# Patient Record
Sex: Female | Born: 1975 | Race: Black or African American | Hispanic: No | Marital: Single | Smoking: Never smoker
Health system: Southern US, Community
[De-identification: ages and names within clinical notes are randomized; demographics above are authoritative.]

## PROBLEM LIST (undated history)

## (undated) DIAGNOSIS — G935 Compression of brain: Secondary | ICD-10-CM

## (undated) DIAGNOSIS — K219 Gastro-esophageal reflux disease without esophagitis: Secondary | ICD-10-CM

## (undated) HISTORY — PX: OTHER SURGICAL HISTORY: SHX169

## (undated) HISTORY — PX: CRANIECTOMY SUBOCCIPITAL W/ CERVICAL LAMINECTOMY / CHIARI: SUR327

## (undated) HISTORY — PX: ABDOMINAL HYSTERECTOMY: SHX81

---

## 2018-06-08 ENCOUNTER — Emergency Department: Payer: BC Managed Care – PPO

## 2018-06-08 ENCOUNTER — Other Ambulatory Visit: Payer: Self-pay

## 2018-06-08 ENCOUNTER — Encounter: Payer: Self-pay | Admitting: Emergency Medicine

## 2018-06-08 ENCOUNTER — Emergency Department
Admission: EM | Admit: 2018-06-08 | Discharge: 2018-06-08 | Disposition: A | Discharge status: Home / Self Care | Payer: BC Managed Care – PPO | Attending: Emergency Medicine | Admitting: Emergency Medicine

## 2018-06-08 DIAGNOSIS — R55 Syncope and collapse: Secondary | ICD-10-CM

## 2018-06-08 DIAGNOSIS — I951 Orthostatic hypotension: Secondary | ICD-10-CM | POA: Diagnosis not present

## 2018-06-08 DIAGNOSIS — R42 Dizziness and giddiness: Secondary | ICD-10-CM | POA: Diagnosis present

## 2018-06-08 HISTORY — DX: Gastro-esophageal reflux disease without esophagitis: K21.9

## 2018-06-08 HISTORY — DX: Compression of brain: G93.5

## 2018-06-08 LAB — CBC WITH DIFFERENTIAL/PLATELET
Abs Immature Granulocytes: 0 10*3/uL (ref 0.00–0.07)
Basophils Absolute: 0 10*3/uL (ref 0.0–0.1)
Basophils Relative: 0 %
Eosinophils Absolute: 0.1 10*3/uL (ref 0.0–0.5)
Eosinophils Relative: 2 %
HCT: 39.7 % (ref 36.0–46.0)
HEMOGLOBIN: 13 g/dL (ref 12.0–15.0)
Immature Granulocytes: 0 %
Lymphocytes Relative: 29 %
Lymphs Abs: 1.7 10*3/uL (ref 0.7–4.0)
MCH: 27.5 pg (ref 26.0–34.0)
MCHC: 32.7 g/dL (ref 30.0–36.0)
MCV: 84.1 fL (ref 80.0–100.0)
Monocytes Absolute: 0.4 10*3/uL (ref 0.1–1.0)
Monocytes Relative: 6 %
Neutro Abs: 3.7 10*3/uL (ref 1.7–7.7)
Neutrophils Relative %: 63 %
Platelets: 308 10*3/uL (ref 150–400)
RBC: 4.72 MIL/uL (ref 3.87–5.11)
RDW: 13.2 % (ref 11.5–15.5)
WBC: 5.8 10*3/uL (ref 4.0–10.5)
nRBC: 0 % (ref 0.0–0.2)

## 2018-06-08 LAB — COMPREHENSIVE METABOLIC PANEL
ALK PHOS: 86 U/L (ref 38–126)
ALT: 13 U/L (ref 0–44)
AST: 17 U/L (ref 15–41)
Albumin: 3.9 g/dL (ref 3.5–5.0)
Anion gap: 7 (ref 5–15)
BUN: 8 mg/dL (ref 6–20)
CO2: 22 mmol/L (ref 22–32)
Calcium: 8.7 mg/dL — ABNORMAL LOW (ref 8.9–10.3)
Chloride: 107 mmol/L (ref 98–111)
Creatinine, Ser: 0.76 mg/dL (ref 0.44–1.00)
GFR calc Af Amer: >60 mL/min (ref >60)
GFR calc non Af Amer: >60 mL/min (ref >60)
Glucose, Bld: 93 mg/dL (ref 70–99)
Potassium: 3.6 mmol/L (ref 3.5–5.1)
Sodium: 136 mmol/L (ref 135–145)
Total Bilirubin: 1 mg/dL (ref 0.3–1.2)
Total Protein: 7.2 g/dL (ref 6.5–8.1)

## 2018-06-08 LAB — URINALYSIS, COMPLETE (UACMP) WITH MICROSCOPIC
Bacteria, UA: NONE SEEN
Bilirubin Urine: NEGATIVE
Glucose, UA: NEGATIVE mg/dL
HGB URINE DIPSTICK: NEGATIVE
Ketones, ur: NEGATIVE mg/dL
Leukocytes, UA: NEGATIVE
NITRITE: NEGATIVE
PROTEIN: NEGATIVE mg/dL
Specific Gravity, Urine: 1.002 — ABNORMAL LOW (ref 1.005–1.030)
pH: 6 (ref 5.0–8.0)

## 2018-06-08 LAB — INFLUENZA PANEL BY PCR (TYPE A & B)
Influenza A By PCR: NEGATIVE
Influenza B By PCR: NEGATIVE

## 2018-06-08 LAB — TROPONIN I
Troponin I: <0.03 ng/mL (ref <0.03)
Troponin I: <0.03 ng/mL (ref <0.03)

## 2018-06-08 LAB — POCT PREGNANCY, URINE: Preg Test, Ur: NEGATIVE

## 2018-06-08 MED ORDER — SODIUM CHLORIDE 0.9 % IV BOLUS
1000.0000 mL | Freq: Once | INTRAVENOUS | Status: AC
  Administered 2018-06-08: 1000 mL via INTRAVENOUS

## 2018-06-08 NOTE — ED Provider Notes (Signed)
[facility] Emergency Department Provider Note  ____________________________________________  Time seen: Approximately 11:55 AM  I have reviewed the triage vital signs and the nursing notes.   HISTORY  Chief Complaint Dizziness   HPI Evelyn Dickerson is a 43 y.o. female history of Chiari I malformation surgically repaired in 2013 and GERD presents for evaluation of near syncope.  Patient reports that she has been under a lot of stress.  Her mother had a TIA last week.  She has not been eating or drinking as much as she should.  She has also been exposed to influenza.  Today she was at work when she started feeling dizzy like she was going to pass out.  She started having black spots in her vision.  She did not fully lose consciousness.  She reports a mild sensation of tightness in her throat that started before this episode of near syncope.  She denies chest pain or shortness of breath.  This sensation is nonpleuritic in nature, no fever or chills, no difficulty breathing, no wheezing, no angioedema, no hives.  No personal history of heart disease.  Father has a history of coronary artery disease.  Patient has no history of smoking.  Past Medical History:  Diagnosis Date  . Chiari I malformation (HCC)   . GERD (gastroesophageal reflux disease)      Past Surgical History:  Procedure Laterality Date  . ABDOMINAL HYSTERECTOMY    . breast reduction    . CRANIECTOMY SUBOCCIPITAL W/ CERVICAL LAMINECTOMY / CHIARI      Prior to Admission medications   Not on File    Allergies Patient has no known allergies.  No family history on file.  Social History Social History   Tobacco Use  . Smoking status: Never Smoker  . Smokeless tobacco: Never Used  Substance Use Topics  . Alcohol use: Yes    Comment: occasional  . Drug use: Not on file    Review of Systems  Constitutional: Negative for fever. + Lightheadedness, near syncope Eyes: Negative for visual  changes. ENT: Negative for sore throat. + tightness of the throat Neck: No neck pain  Cardiovascular: Negative for chest pain. Respiratory: Negative for shortness of breath. Gastrointestinal: Negative for abdominal pain, vomiting or diarrhea. Genitourinary: Negative for dysuria. Musculoskeletal: Negative for back pain. Skin: Negative for rash. Neurological: Negative for headaches, weakness or numbness. Psych: No SI or HI  ____________________________________________   PHYSICAL EXAM:  VITAL SIGNS: ED Triage Vitals  Enc Vitals Group     BP 06/08/18 1037 (!) 136/93     Pulse --      Resp 06/08/18 1112 (!) 29     Temp --      Temp src --      SpO2 --      Weight 06/08/18 1033 285 lb (129.3 kg)     Height 06/08/18 1033 5\' 9"  (1.753 m)     Head Circumference --      Peak Flow --      Pain Score 06/08/18 1033 0     Pain Loc --      Pain Edu? --      Excl. in GC? --     Constitutional: Alert and oriented. Well appearing and in no apparent distress. HEENT:      Head: Normocephalic and atraumatic.         Eyes: Conjunctivae are normal. Sclera is non-icteric.       Mouth/Throat: Mucous membranes are moist.  No  angioedema      Neck: Supple with no signs of meningismus. Cardiovascular: Regular rate and rhythm. No murmurs, gallops, or rubs. 2+ symmetrical distal pulses are present in all extremities. No JVD. Respiratory: Normal respiratory effort. Lungs are clear to auscultation bilaterally. No wheezes, crackles, or rhonchi.  Gastrointestinal: Soft, non tender, and non distended with positive bowel sounds. No rebound or guarding. Musculoskeletal: Nontender with normal range of motion in all extremities. No edema, cyanosis, or erythema of extremities. Neurologic: Normal speech and language. Face is symmetric. Moving all extremities. No gross focal neurologic deficits are appreciated. Skin: Skin is warm, dry and intact. No rash noted. Psychiatric: Mood and affect are normal. Speech  and behavior are normal.  ____________________________________________   LABS (all labs ordered are listed, but only abnormal results are displayed)  Labs Reviewed  COMPREHENSIVE METABOLIC PANEL - Abnormal; Notable for the following components:      Result Value   Calcium 8.7 (*)    All other components within normal limits  URINALYSIS, COMPLETE (UACMP) WITH MICROSCOPIC - Abnormal; Notable for the following components:   Color, Urine COLORLESS (*)    APPearance CLEAR (*)    Specific Gravity, Urine 1.002 (*)    All other components within normal limits  CBC WITH DIFFERENTIAL/PLATELET  TROPONIN I  INFLUENZA PANEL BY PCR (TYPE A & B)  TROPONIN I  POCT PREGNANCY, URINE   ____________________________________________  EKG  ED ECG REPORT I, Nita Sicklearolina Haruna Rohlfs, the attending physician, personally viewed and interpreted this ECG.  Normal sinus rhythm, rate of 75, normal intervals, normal axis, T wave inversions in anterior leads with no ST elevation.  No prior for comparison. ____________________________________________  RADIOLOGY  I have personally reviewed the images performed during this visit and I agree with the Radiologist's read.   Interpretation by Radiologist:  Dg Chest 2 View  Result Date: 06/08/2018 CLINICAL DATA:  Chest pain. EXAM: CHEST - 2 VIEW COMPARISON:  None. FINDINGS: The heart size and mediastinal contours are within normal limits. Both lungs are clear. No pneumothorax or pleural effusion is noted. The visualized skeletal structures are unremarkable. IMPRESSION: No active cardiopulmonary disease. Electronically Signed   By: Lupita RaiderJames  Green Jr, M.D.   On: 06/08/2018 12:44      ____________________________________________   PROCEDURES  Procedure(s) performed: None Procedures Critical Care performed:  None ____________________________________________   INITIAL IMPRESSION / ASSESSMENT AND PLAN / ED COURSE   43 y.o. female history of Chiari I malformation  surgically repaired in 2013 and GERD presents for evaluation of near syncope.    Ddx orthostatic hypotension, dehydration, aki, influenza, anemia, ACS.    Patient is well-appearing and in no distress, severely orthostatic with blood pressure dropping from 128/84 to  88/70 when going lying to sitting.  Patient became extremely dizzy and was unable to stand up.  Patient endorses decreased oral intake and has been going through a lot of stress recently.  She is complaining of some tightness in her throat therefore an EKG was done which shows T wave inversions in anterior leads but no ST patient's.  There is no prior for comparison.  Cardiac enzymes are pending.  2 L of normal saline are being given.  Labs, chest x-ray and flu swab are pending     _________________________ 4:27 PM on 06/08/2018 -----------------------------------------  Labs within normal limits.  Flu negative.  Chest x-ray negative.  Patient received 2 L of fluid with full resolution of her symptoms and no longer orthostatic.  Presentation most likely due  to dehydration.  Will discharge home with follow-up with primary care doctor.  Discussed return precautions.   As part of my medical decision making, I reviewed the following data within the electronic MEDICAL RECORD NUMBER Nursing notes reviewed and incorporated, Labs reviewed , EKG interpreted , Old chart reviewed, Radiograph reviewed , Notes from prior ED visits and [place] Controlled Substance Database    Pertinent labs & imaging results that were available during my care of the patient were reviewed by me and considered in my medical decision making (see chart for details).    ____________________________________________   FINAL CLINICAL IMPRESSION(S) / ED DIAGNOSES  Final diagnoses:  Orthostatic hypotension  Near syncope      NEW MEDICATIONS STARTED DURING THIS VISIT:  ED Discharge Orders    None       Note:  This document was prepared using Dragon voice  recognition software and may include unintentional dictation errors.    Don PerkingVeronese, [place], MD 06/08/18 [phone]

## 2018-06-08 NOTE — ED Notes (Signed)
Flu and 2nd trop sent at this time.

## 2018-06-08 NOTE — ED Notes (Signed)
Pt states she has had episodes of dizziness where she is sitting and seeing dots while at work. This morning she felt it while moving around.

## 2018-06-08 NOTE — ED Notes (Signed)
Pt ambulating to bedside commode at this time with assistance. Pt denies dizziness at this time with ambulation. Pt with steady gait. Peri-care performed independently. Pt in NAD at this time. This RN will continue to monitor.

## 2018-06-08 NOTE — ED Triage Notes (Signed)
States while up and moving around this morning, began to fell dizzy.  While sitting down working, started to see spots.  Reports seeing spots in all visual fields.  States episodes were intermittent.  AAOx3.  Skin warm and dry.  NAD.  MAE equally and strong.facial movements equal.  Speech clear

## 2018-06-08 NOTE — ED Notes (Addendum)
This RN attempted IV twice. Hunter RN was asked to attempt. EDP at bedside and aware of drop in Orthostatic BP

## 2019-06-28 IMAGING — CR DG CHEST 2V
2 series · 2 of 2 positions shown · non-contrast
Comparison: None.

CLINICAL DATA: Chest pain.

EXAM:
CHEST - 2 VIEW

[chest pa]
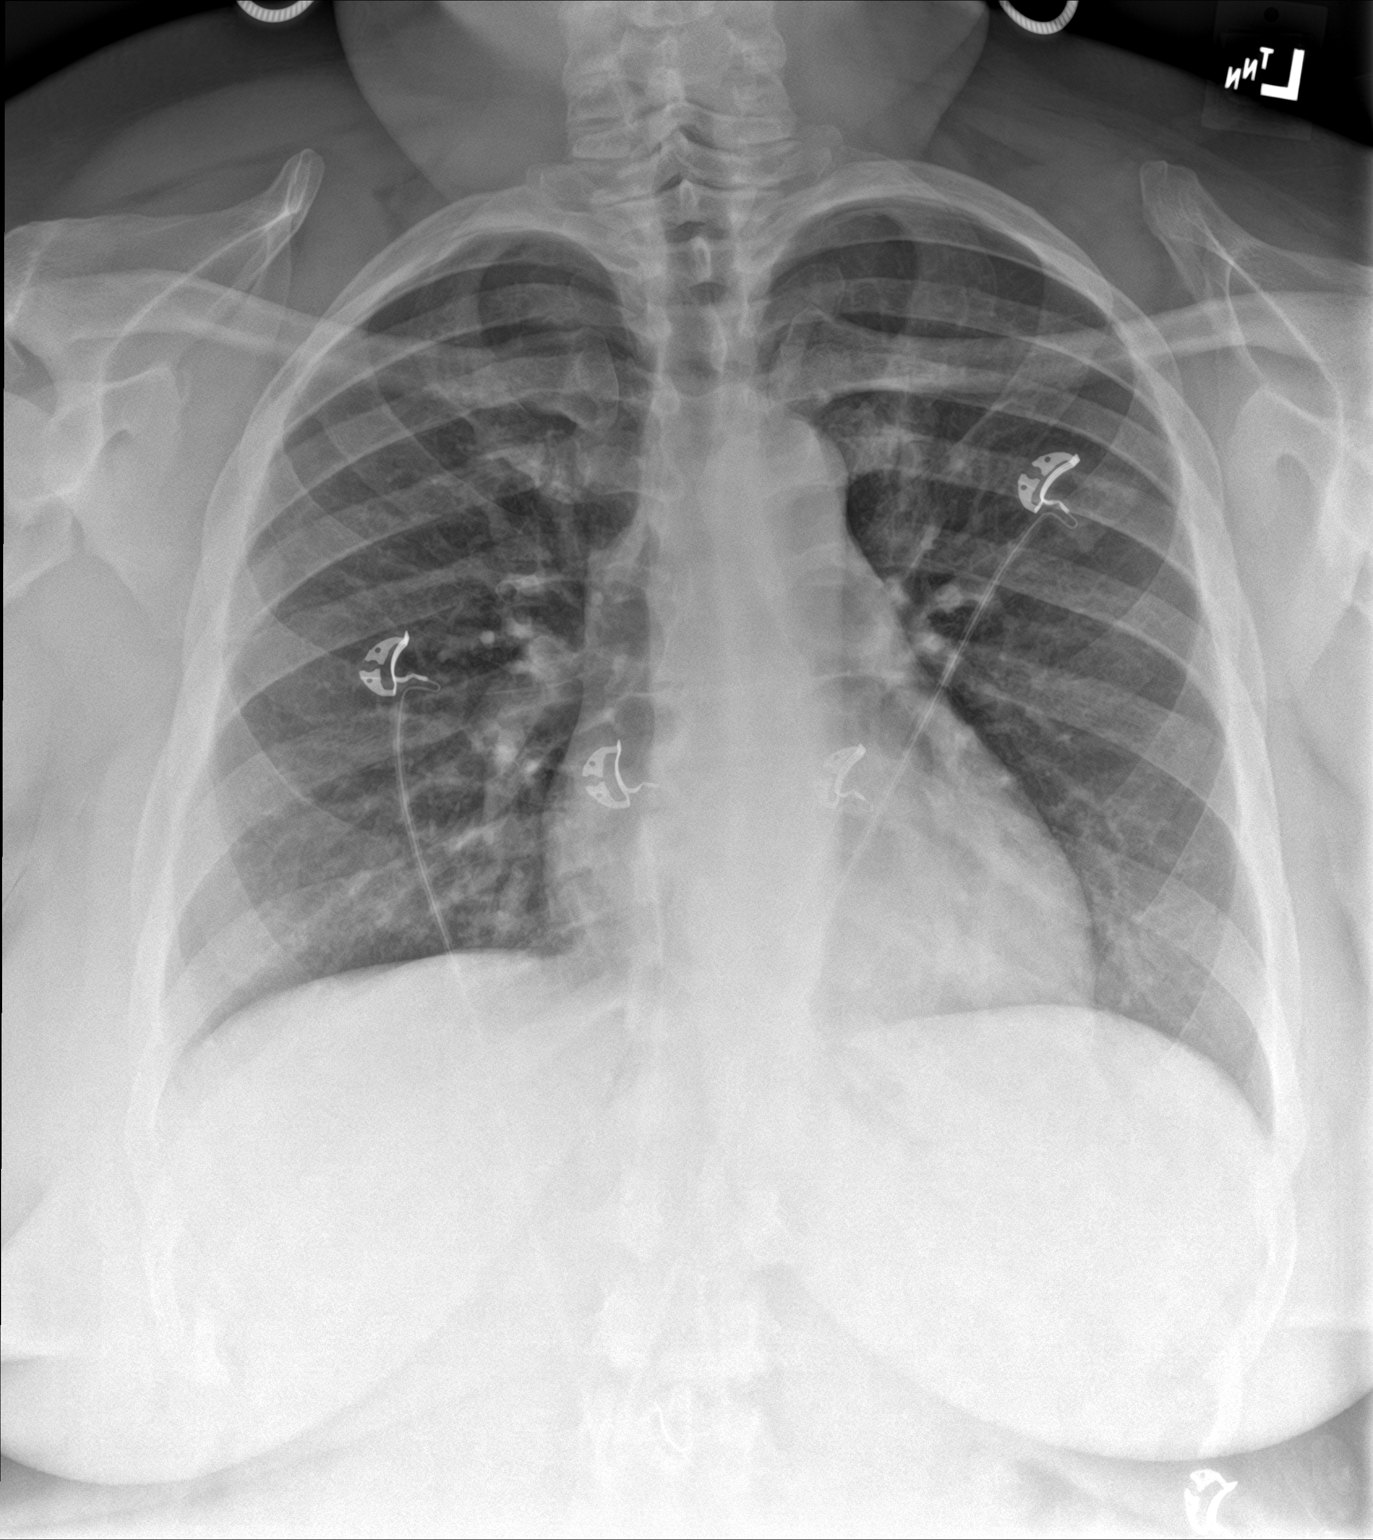

[chest lat]
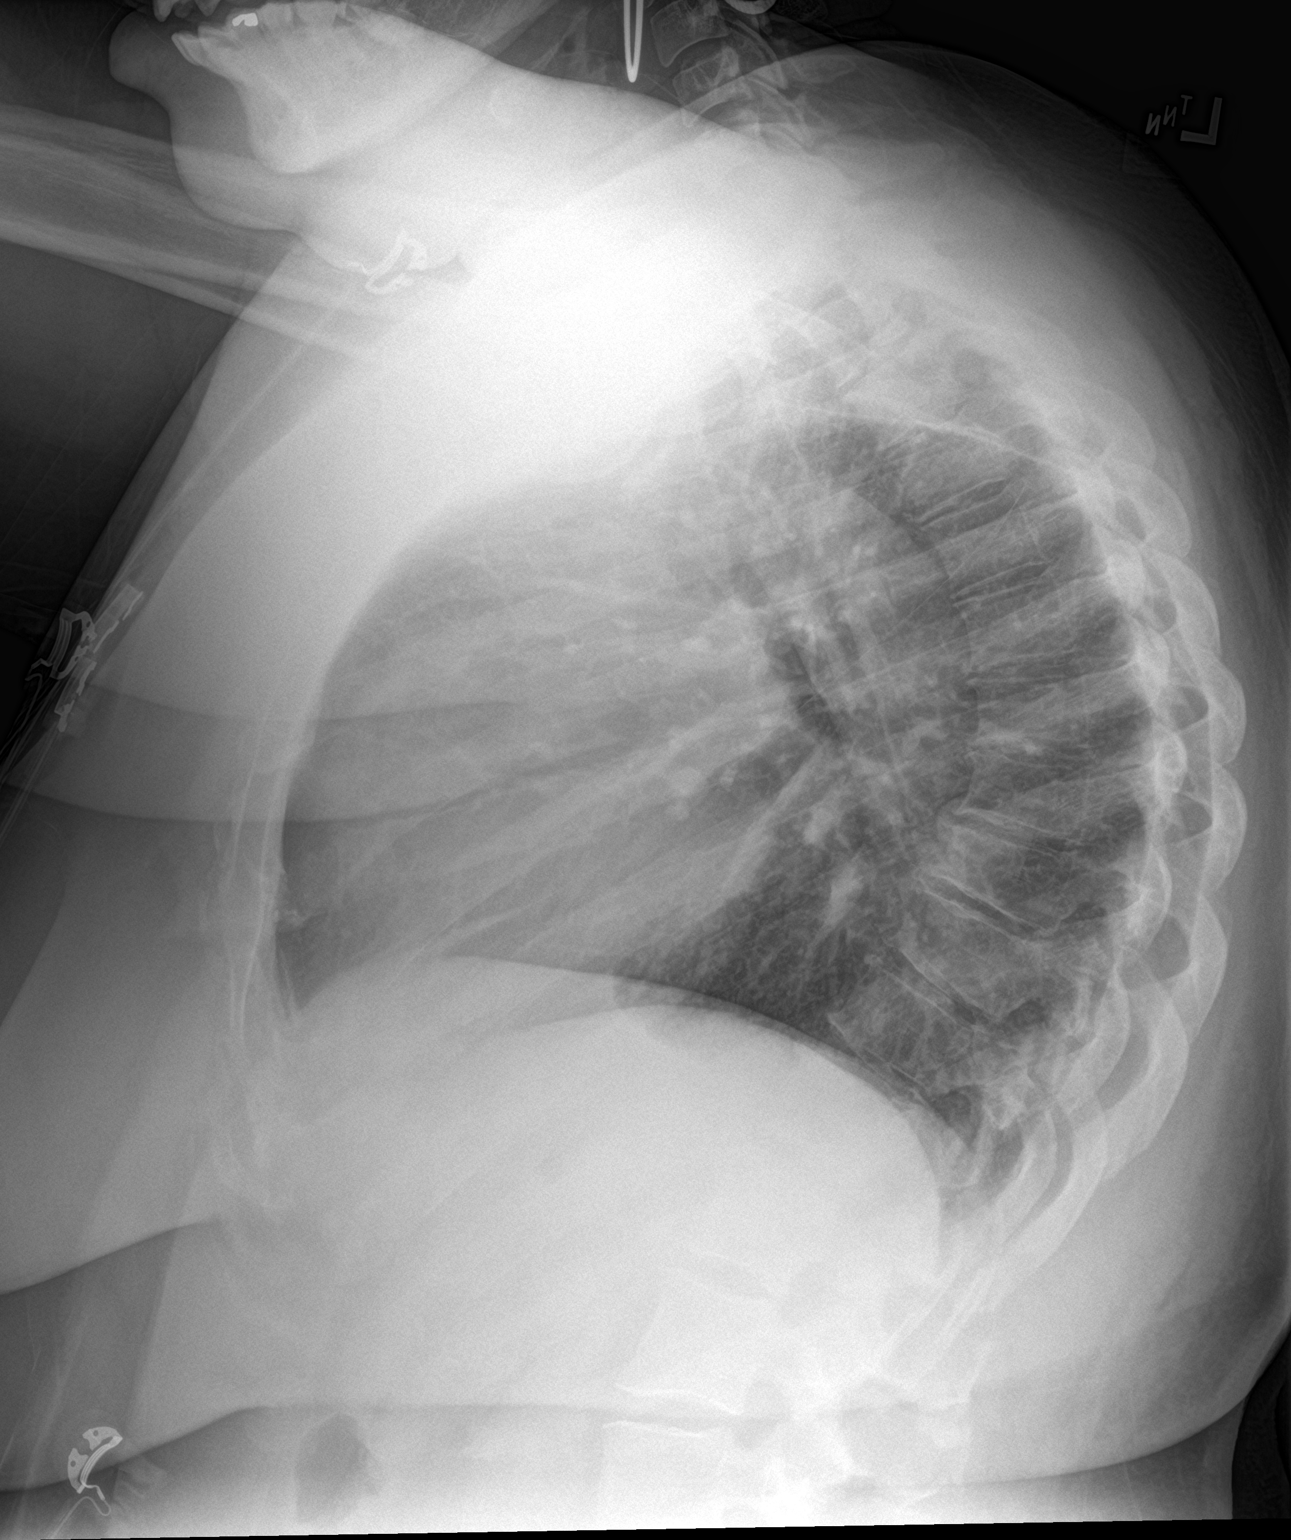

[2 of 2 positions shown; findings below may reference images not displayed]

FINDINGS: The heart size and mediastinal contours are within normal limits.
Both lungs are clear. No pneumothorax or pleural effusion is noted.
The visualized skeletal structures are unremarkable.
IMPRESSION: No active cardiopulmonary disease.
